# Patient Record
Sex: Female | Born: 1940 | Race: White | Hispanic: No | Smoking: Current every day smoker
Health system: Southern US, Community
[De-identification: ages and names within clinical notes are randomized; demographics above are authoritative.]

## PROBLEM LIST (undated history)

## (undated) DIAGNOSIS — I10 Essential (primary) hypertension: Secondary | ICD-10-CM

## (undated) DIAGNOSIS — J449 Chronic obstructive pulmonary disease, unspecified: Secondary | ICD-10-CM

## (undated) HISTORY — PX: BRAIN SURGERY: SHX531

## (undated) HISTORY — DX: Chronic obstructive pulmonary disease, unspecified: J44.9

## (undated) HISTORY — DX: Essential (primary) hypertension: I10

## (undated) HISTORY — PX: ABDOMINAL HYSTERECTOMY: SHX81

## (undated) HISTORY — PX: CHOLECYSTECTOMY: SHX55

---

## 2015-05-14 ENCOUNTER — Encounter (FREE_STANDING_LABORATORY_FACILITY)
Admit: 2015-05-14 | Discharge: 2015-05-14 | Disposition: A | Discharge status: Home / Self Care | Payer: Self-pay | Attending: Internal Medicine | Admitting: Internal Medicine

## 2015-05-14 ENCOUNTER — Other Ambulatory Visit (FREE_STANDING_LABORATORY_FACILITY): Payer: Self-pay | Admitting: Internal Medicine

## 2015-05-15 LAB — HISTORICAL SURGICAL PATHOLOGY SPECIMEN

## 2015-05-21 ENCOUNTER — Encounter (FREE_STANDING_LABORATORY_FACILITY)
Admit: 2015-05-21 | Discharge: 2015-05-21 | Disposition: A | Discharge status: Home / Self Care | Payer: Self-pay | Attending: Internal Medicine | Admitting: Internal Medicine

## 2015-05-25 LAB — HISTORICAL SURGICAL PATHOLOGY SPECIMEN

## 2016-11-28 ENCOUNTER — Other Ambulatory Visit (HOSPITAL_COMMUNITY): Payer: Self-pay

## 2016-11-28 ENCOUNTER — Other Ambulatory Visit (HOSPITAL_COMMUNITY): Admit: 2016-11-28 | Discharge: 2016-11-28 | Disposition: A | Discharge status: Home / Self Care | Payer: Self-pay

## 2016-11-28 DIAGNOSIS — E119 Type 2 diabetes mellitus without complications: Secondary | ICD-10-CM

## 2016-12-21 ENCOUNTER — Other Ambulatory Visit (HOSPITAL_COMMUNITY): Payer: Self-pay

## 2016-12-21 ENCOUNTER — Ambulatory Visit
Admission: RE | Admit: 2016-12-21 | Discharge: 2016-12-21 | Disposition: A | Discharge status: Home / Self Care | Payer: Medicare Other | Source: Ambulatory Visit

## 2016-12-21 DIAGNOSIS — E039 Hypothyroidism, unspecified: Secondary | ICD-10-CM | POA: Insufficient documentation

## 2016-12-21 DIAGNOSIS — R61 Generalized hyperhidrosis: Secondary | ICD-10-CM

## 2016-12-21 DIAGNOSIS — E119 Type 2 diabetes mellitus without complications: Secondary | ICD-10-CM | POA: Insufficient documentation

## 2016-12-21 LAB — BASIC METABOLIC PANEL, FASTING
ANION GAP: 10 mmol/L (ref 5–15)
BUN/CREA RATIO: 13 (ref 6–20)
BUN: 14 mg/dL (ref 8–26)
CALCIUM: 9.3 mg/dL (ref 8.9–10.3)
CHLORIDE: 99 mmol/L — ABNORMAL LOW (ref 101–111)
CO2 TOTAL: 29 mmol/L (ref 22–32)
CREATININE: 1.07 mg/dL (ref 0.60–1.10)
ESTIMATED GFR: 50 mL/min/1.73mˆ2 — ABNORMAL LOW (ref >60)
GLUCOSE: 108 mg/dL (ref 70–110)
POTASSIUM: 4.3 mmol/L (ref 3.6–5.1)
SODIUM: 138 mmol/L (ref 136–144)

## 2016-12-21 LAB — MICROALBUMIN/CREATININE RATIO, URINE, RANDOM
CREATININE RANDOM URINE: 32 mg/dL
MICROALBUMIN RANDOM URINE: <0.2 mg/dL — ABNORMAL LOW (ref 0.2–1.9)

## 2016-12-21 LAB — HGA1C (HEMOGLOBIN A1C WITH EST AVG GLUCOSE)
ESTIMATED AVERAGE GLUCOSE: 131 mg/dL
HEMOGLOBIN A1C: 6.2 % — ABNORMAL HIGH (ref 4.3–6.1)

## 2016-12-21 LAB — THYROID STIMULATING HORMONE WITH FREE T4 REFLEX: TSH: 2.46 u[IU]/mL (ref 0.340–5.600)

## 2017-01-13 ENCOUNTER — Other Ambulatory Visit
Admission: RE | Admit: 2017-01-13 | Discharge: 2017-01-13 | Disposition: A | Discharge status: Home / Self Care | Payer: Medicare Other

## 2017-01-20 LAB — 5-HYDROXYINDOLEACETIC ACID (5-HIAA), URINE
COLLECTION DURATION: 24 h
COLLECTION DURATION: 24 h
URINE VOLUME: 800 mL

## 2017-03-07 ENCOUNTER — Other Ambulatory Visit (HOSPITAL_COMMUNITY): Payer: Self-pay

## 2017-03-07 DIAGNOSIS — R61 Generalized hyperhidrosis: Secondary | ICD-10-CM

## 2017-03-09 ENCOUNTER — Encounter (HOSPITAL_COMMUNITY): Payer: Self-pay

## 2017-03-09 ENCOUNTER — Other Ambulatory Visit
Admission: RE | Admit: 2017-03-09 | Discharge: 2017-03-09 | Disposition: A | Discharge status: Home / Self Care | Payer: Medicare Other | Source: Ambulatory Visit

## 2017-03-09 DIAGNOSIS — R61 Generalized hyperhidrosis: Secondary | ICD-10-CM | POA: Insufficient documentation

## 2017-03-14 LAB — 5-HYDROXYINDOLEACETIC ACID (5-HIAA), URINE: COLLECTION DURATION: 24 h

## 2017-08-14 ENCOUNTER — Other Ambulatory Visit (HOSPITAL_COMMUNITY): Payer: Self-pay

## 2017-08-14 ENCOUNTER — Ambulatory Visit
Admission: RE | Admit: 2017-08-14 | Discharge: 2017-08-14 | Disposition: A | Discharge status: Home / Self Care | Payer: Medicare Other | Source: Ambulatory Visit

## 2017-08-14 DIAGNOSIS — R079 Chest pain, unspecified: Secondary | ICD-10-CM

## 2017-08-15 LAB — LIPID PANEL
CHOL/HDL RATIO: 4.1 (ref 0.0–4.4)
CHOLESTEROL: 205 mg/dL — ABNORMAL HIGH (ref 0–199)
HDL CHOL: 50 mg/dL (ref 38–85)
LDL CALC: 125 mg/dL — ABNORMAL HIGH (ref 0–100)
TRIGLYCERIDES: 149 mg/dL (ref 0–149)
VLDL CALC: 30 mg/dL (ref 6–49)

## 2017-11-30 ENCOUNTER — Other Ambulatory Visit (HOSPITAL_COMMUNITY): Payer: Self-pay

## 2017-11-30 ENCOUNTER — Other Ambulatory Visit (HOSPITAL_COMMUNITY): Admit: 2017-11-30 | Discharge: 2017-11-30 | Disposition: A | Discharge status: Home / Self Care | Payer: Self-pay

## 2017-11-30 DIAGNOSIS — N183 Chronic kidney disease, stage 3 unspecified (CMS HCC): Secondary | ICD-10-CM

## 2017-11-30 DIAGNOSIS — E1122 Type 2 diabetes mellitus with diabetic chronic kidney disease: Secondary | ICD-10-CM

## 2017-11-30 DIAGNOSIS — E039 Hypothyroidism, unspecified: Secondary | ICD-10-CM

## 2018-01-12 ENCOUNTER — Ambulatory Visit (INDEPENDENT_AMBULATORY_CARE_PROVIDER_SITE_OTHER): Payer: Medicare Other | Admitting: Neurology

## 2018-02-07 ENCOUNTER — Ambulatory Visit (INDEPENDENT_AMBULATORY_CARE_PROVIDER_SITE_OTHER): Payer: Self-pay | Admitting: Neurology

## 2018-02-07 NOTE — Telephone Encounter (Signed)
Sharon Meza there is nothing in system yet . Do you still have these charts that you can send office notes .

## 2018-02-07 NOTE — Telephone Encounter (Signed)
-----   Message from Georgina Peer sent at 02/07/2018  3:08 PM EDT -----  States they referred patient to see Dr Quentin Mulling on 9.13.19 in [place] but they did not get any notes.  Fax # is [phone].  thanks

## 2018-04-05 ENCOUNTER — Encounter (INDEPENDENT_AMBULATORY_CARE_PROVIDER_SITE_OTHER): Payer: Self-pay

## 2018-04-05 NOTE — Nursing Note (Signed)
[facility]- Population Health Patient Navigator    Patient navigator accessed patient's chart to update PCP information [facility].    Sharon PettiesMatthew Amarylis Meza, PATIENT NAVIGATOR  04/05/2018, 12:59

## 2018-09-12 ENCOUNTER — Other Ambulatory Visit: Payer: Self-pay | Admitting: Family Medicine

## 2018-09-12 DIAGNOSIS — R1084 Generalized abdominal pain: Secondary | ICD-10-CM

## 2018-09-19 ENCOUNTER — Ambulatory Visit
Admission: RE | Admit: 2018-09-19 | Discharge: 2018-09-19 | Disposition: A | Discharge status: Home / Self Care | Payer: Medicare Other | Source: Ambulatory Visit | Attending: Family Medicine | Admitting: Family Medicine

## 2018-09-19 DIAGNOSIS — R1084 Generalized abdominal pain: Secondary | ICD-10-CM

## 2018-09-19 MED ORDER — IOPAMIDOL (ISOVUE-300) INJECTION 61%
100.0000 mL | Freq: Once | INTRAVENOUS | Status: AC | PRN
  Administered 2018-09-19: 13:00:00 100 mL via INTRAVENOUS

## 2018-09-25 ENCOUNTER — Telehealth: Payer: Self-pay

## 2018-09-25 ENCOUNTER — Telehealth: Payer: Self-pay | Admitting: Hematology

## 2018-09-25 NOTE — Telephone Encounter (Signed)
Daughter, Pollyann Samples, called to voice concerns about the no visitor policy due to Covid.  "Patient can hear if in a room with others but speaker needs to speak loudly."  I explained role of nurse navigator and educated on various treatment team members that are available to support patient. Daughter has my direct phone number for questions.

## 2018-09-25 NOTE — Progress Notes (Signed)
[facility]   [phone] [phone]   Clinic New Consult Note   Patient Care Team: Lujean Amel, MD as PCP - General (Family Medicine)  Date of Service:  09/26/2018   CHIEF COMPLAINTS/PURPOSE OF CONSULTATION:  Suspicious CT Mass with metastasis  REFERRING PHYSICIAN:  Dr Dorthy Cooler   HISTORY OF PRESENTING ILLNESS:  Kristi Allison 78 y.o. female is a here because of suspicious CT mass with metastasis. The patient was referred by her PCP. The patient presents to the clinic today with GI Navigator Kristi Allison. Her daughter Kristi Allison was called on the phone to be in the visit today and by her historian as the patient is very hard of hearing.   For 1 year she has had significant diarrhea, intermittent ULQ pain, N&V which has worsened lately.. When she moved to [facility] in 11/2017. She then started to see PCP for work up. For years she had discoloration of her legs which was biopsied to be related to diabetes, per daughter. 2-3 weeks ago she developed LE swelling, mainly in her right leg with pain around right ankle. She had gained 20-26 pounds lately due to fluid collection. She tried compression sock and elevation with no help. 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 She was referred to me.   She has mid abdominal pain 7-8/10. 10+ watery diarrhea stool a day, with incontinence wearing 5 depends a day. She has N&V as well. With no bowel movement in a day she will feel constipated with abdominal pressure/bloating. She does not take anything for pain or her diarrhea. She does not like taking pain medications. She is not high functioning and mostly lays down due to leg swelling and pain. She is not using cane or walker. She uses wall to walk.  No chest pain, but has SOB due to COPD. She has had COPD for several years. She is not on oxygen but is suppose to be on inhaler but due to cost she d/c.  Her daughter notes seeing her family  in [place] which is a 5 hour drive.   Socially she lives with her daughter. She has 5 children total.  She has reduced smoking to 5 cigarettes a day. She smoked for 65 years. She was drinking 1-3 drinks a week of coffee liquor, she has not been drinking lately. I reviewed her medication list with her. She is on Lasix and HTN medication and Synthroid. They have a PMHx of. COPD, Hearing loss, HTN, Brain aneurysm s/o shunt placed. In [place] she was told she had spot on lung, which is not suspicions. She had gallbladder removed, hysterectomy, brain surgery for shunt placement.  She cannot remember if her father had cancer. She notes her brother had lung cancer.     REVIEW OF SYSTEMS:    Constitutional: Denies fevers, chills or abnormal night sweats (+) weight gain  Eyes: Denies blurriness of vision, double vision or watery eyes Ears, nose, mouth, throat, and face: Denies mucositis or sore throat (+) hard of hearing  Respiratory: Denies cough, dyspnea or wheezes Cardiovascular: Denies palpitation, chest discomfort (+) B/l LE edema, R>L Gastrointestinal: (+) watery diarrhea, no blood with incontinence (+) Nausea and Vomiting (+) Mid abdominal pain (7-8/10) (+) abdominal bloating and pressure  Skin: Denies abnormal skin rashes (+) Skin discoloration with welts of b/l legs  Lymphatics: Denies new lymphadenopathy or easy bruising Neurological:Denies numbness, tingling or new weaknesses Behavioral/Psych: Mood is stable, no new changes  All other systems were reviewed with the patient and are negative.   MEDICAL HISTORY:  Past Medical History:  Diagnosis Date   COPD (chronic obstructive pulmonary disease) ([place])    Hypertension     SURGICAL HISTORY: Past Surgical History:  Procedure Laterality Date   ABDOMINAL HYSTERECTOMY     BRAIN SURGERY     hemorrhage    CHOLECYSTECTOMY      SOCIAL HISTORY: Social History   Socioeconomic History   Marital status: Unknown    Spouse  name: Not on file   Number of children: 5   Years of education: Not on file   Highest education level: Not on file  Occupational History   Not on file  Social Needs   Financial resource strain: Not on file   Food insecurity:    Worry: Not on file    Inability: Not on file   Transportation needs:    Medical: Not on file    Non-medical: Not on file  Tobacco Use   Smoking status: Current Every Day Smoker    Packs/day: 0.25    Years: 65.00    Pack years: 16.25   Smokeless tobacco: Never Used  Substance and Sexual Activity   Alcohol use: Yes    Alcohol/week: 2.0 standard drinks    Types: 2 Shots of liquor per week    Comment: not recently    Drug use: Not on file   Sexual activity: Not on file  Lifestyle   Physical activity:    Days per week: Not on file    Minutes per session: Not on file   Stress: Not on file  Relationships   Social connections:    Talks on phone: Not on file    Gets together: Not on file    Attends religious service: Not on file    Active member of club or organization: Not on file    Attends meetings of clubs or organizations: Not on file    Relationship status: Not on file   Intimate partner violence:    Fear of current or ex partner: Not on file    Emotionally abused: Not on file    Physically abused: Not on file    Forced sexual activity: Not on file  Other Topics Concern   Not on file  Social History Narrative   Not on file    FAMILY HISTORY: Family History  Problem Relation Age of Onset   Cancer Brother        lung cancer    ALLERGIES:  has No Known Allergies.  MEDICATIONS:  Current Outpatient Medications  Medication Sig Dispense Refill   ascorbic acid (VITAMIN C) 500 MG tablet Take 500 mg by mouth daily.     furosemide (LASIX) 40 MG tablet Take 40 mg by mouth daily. Taking 1 1/2 daily     levothyroxine (SYNTHROID) 88 MCG tablet Take 88 mcg by mouth daily before breakfast.     metoprolol succinate  (TOPROL-XL) 100 MG 24 hr tablet Take 100 mg by mouth daily. Take with or immediately following a meal.     omeprazole (PRILOSEC) 40 MG capsule Take 40 mg by mouth daily.     sertraline (ZOLOFT) 50 MG tablet Take 50 mg by mouth daily.     vitamin E 400 UNIT capsule Take 400 Units by mouth daily.     ondansetron (ZOFRAN) 8 MG tablet Take 1 tablet (8 mg total) by mouth every 8 (eight) hours as needed for nausea or vomiting.  30 tablet 1   prochlorperazine (COMPAZINE) 5 MG tablet Take 1 tablet (5 mg total) by mouth every 6 (six) hours as needed for nausea or vomiting. 30 tablet 0   traMADol (ULTRAM) 50 MG tablet Take 0.5-1 tablets (25-50 mg total) by mouth every 6 (six) hours as needed. 30 tablet 1   No current facility-administered medications for this visit.     PHYSICAL EXAMINATION: ECOG PERFORMANCE STATUS: 3 - Symptomatic, >50% confined to bed  Vitals:   09/26/18 1427  BP: 137/68  Pulse: 61  Resp: 18  Temp: 98.2 F (36.8 C)  SpO2: 99%   Filed Weights   09/26/18 1427  Weight: 187 lb 4.8 oz (85 kg)    GENERAL:alert, no distress and comfortable SKIN: skin color, texture, turgor are normal, no rashes or significant lesions (+) Purplish skin pigmentation of lower right leg proximal and lateral to knee EYES: normal, Conjunctiva are pink and non-injected, sclera clear  NECK: supple, thyroid normal size, non-tender, without nodularity LYMPH:  no palpable lymphadenopathy in the cervical, axillary  LUNGS: clear to auscultation and percussion with normal breathing effort HEART: regular rate & rhythm and no murmurs (+) moderate lower extremity edema, R>L up to knee ABDOMEN:abdomen soft, non-tender and normal bowel sounds (+) epigastric tenderness Musculoskeletal:no cyanosis of digits and no clubbing  NEURO: alert & oriented x 3 with fluent speech, no focal motor/sensory deficits  LABORATORY DATA:  I have reviewed the data as listed No flowsheet data found.  No flowsheet data  found.   RADIOGRAPHIC STUDIES: I have personally reviewed the radiological images as listed and agreed with the findings in the report. Ct Abdomen Pelvis W Contrast  Result Date: 09/20/2018 CLINICAL DATA:  Mid abdomen pain for several months with 20 pound weight gain. EXAM: CT ABDOMEN AND PELVIS WITH CONTRAST TECHNIQUE: Multidetector CT imaging of the abdomen and pelvis was performed using the standard protocol following bolus administration of intravenous contrast. CONTRAST:  17mL ISOVUE-300 IOPAMIDOL (ISOVUE-300) INJECTION 61% COMPARISON:  None. FINDINGS: Lower chest: No acute abnormality. Hepatobiliary: Several abnormal enhancing heterogeneous masses are identified in the liver, largest at the junction the right and left lobe liver measuring at least 8 x 8.8 x 7.6 cm. There is dilatation of intrahepatic biliary tree with abnormal enhancement along the biliary tree. The gallbladder is not seen. Pancreas: There is a 3.9 x 2.8 cm heterogeneous slight low-density masslike lesion in the head of the pancreas. Spleen: Normal in size without focal abnormality. Adrenals/Urinary Tract: Adrenal glands are unremarkable. Kidneys are normal, without renal calculi, focal lesion, or hydronephrosis. Bladder is unremarkable. Stomach/Bowel: Stomach is within normal limits. Appendix appears normal. No evidence of bowel wall thickening, distention, or inflammatory changes. Vascular/Lymphatic: There is abnormal enhancing mass between the liver and the stomach measuring at least 3.6 x 2.6 cm. This may represent a conglomerate of abnormal metastatic lymph nodes. There is infrarenal abdominal aortic aneurysm measuring 4.1 cm in diameter. IVC filter is identified. Reproductive: The uterus is not seen. Other: Moderate volume ascites is identified in the abdomen and pelvis. Musculoskeletal: Small lytic lucencies are identified probably in the T12 vertebral body. IMPRESSION: multiple enhancing masses in the liver largest measures 8.8  cm in size. There is evidence of abnormal enhancement along the intrahepatic biliary tree suggesting neoplasm along the biliary tree. There are abnormal enhancing lymph nodes between the liver and the stomach measuring at least 3.6 cm suggesting metastatic lymph nodes. 3.9 cm heterogeneous slight low-density masslike lesion is identified in the head of  the pancreas. Moderate ascites in the abdomen and pelvis. This constellation of findings is consistent with malignancy. Differential diagnosis includes but is not limited to hepatocellular carcinoma, pancreatic carcinoma, cholangio carcinoma. Electronically Signed   By: Kristi Allison M.D.   On: 09/20/2018 08:09    ASSESSMENT & PLAN:  Rosezella Kronick is a 78 y.o. Caucasian female with a history of COPD, HTN, Hearing Loss   1. Liver and pancreatic lesions, suspicion for metastatic cancer  -I reviewed her symptoms and recent CT abd/pel scan from 09/20/18 with patient and her daughter. CT images were reviewed in person. It shows multiple liver lesions, with a dominant 8.8 cm lesion, a lesion in the, and large adenopathy between liver and stomach, which high suspicion for metastatic disease in liver. The primary is likely pancrease. Given the severe diarrhea, neuroendocrine tumor is also possible. Other metastatic cancer from GI tract or lung cancer (due to her heavy smoking history) are also possible.   -I recommend biopsy of liver lesion to establish diagnosis as well as PET scan for complete staging and looking for primary tumor. She and daughter agreed.  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 However given her advanced age and poor PS she is unlikely an candidate for chemo, which pt and her daughter agree.  -We also discussed palliative care alone. Her daughters are concerned about her quality of life and leaning towards palliative and try to avoid unnecessary tests and procedures. I  discussed with pt and she does want to know the diagnosis and want to pursue PET and biopsy. I discussed treatment depends on type of cancer and her performance status. They understand.  -f/u in 2 weeks after the above work up   2. Mid abdominal pain and bloating  -Not on pain medication, but pain at 7-8/10. She is hesitant to use pain medication.  -I will call in Tramadol today (09/26/18), she can use 25-50mg  q6h as needed .  -she has small ascites, if increases, will consider paracentesis  -Will monitor.    3. Significant diarrhea, N&V -Has watery diarrhea 10+ times a day with incontinence. She changes Depends 5 times a day  -When she takes anti-diarrheals it results in constipation and abdominal pain.  -I encouraged her to take less Imodium to avoid constipation.  -I called in Zofran and Compazine to help with Nausea.    4. LE swelling up to knee, R>L -She tried compression sock and elevation alone which did not control swelling.  -She ambulates holding against wall, unsteady.  -She is on Lasix 60mg  daily.  -Will obtain doppler to rule out blood clot.    5. Smoking history, COPD  -She has been smoking for 65 years. She has recently reduced to 5 cigarettes per day. Her daughter notes she is Angelia Mould not going to quit completely.  -She has COPD, not on Oxygen and not taking inhaler due to high cost.    PLAN:  I called in Tramadol, Zofran, Compazine  Lab and doppler LE tomorrow or Friday  PET scan in 1-2 weeks IR liver biopsy in 1-2 weeks  F/u in 2 weeks after the above workup    Orders Placed This Encounter  Procedures   NM PET Image Initial (PI) Skull Base To Thigh    Standing Status:   Future    Standing Expiration Date:   09/26/2019    Order Specific Question:   If indicated for the ordered  procedure, I authorize the administration of a radiopharmaceutical per Radiology protocol    Answer:   Yes    Order Specific Question:   Preferred imaging location?    Answer:   [facility]    Order Specific Question:   Radiology Contrast Protocol - do NOT remove file path    Answer:   [email or web address]   US BIOPSY (LIVER)    Standing Status:   Future    Standing Expiration Date:   11/26/2019    Order Specific Question:   Lab orders requested (DO NOT place separate lab orders, these will be automatically ordered during procedure specimen collection):    Answer:   Surgical Pathology    Order Specific Question:   Reason for Exam (SYMPTOM  OR DIAGNOSIS REQUIRED)    Answer:   confirm diagnosis and tissue for molecular testing    Order Specific Question:   Preferred location?    Answer:   [facility]   CBC with Differential (Cancer Center Only)    Standing Status:   Future    Standing Expiration Date:   09/26/2019   CMP (Cancer Center only)    Standing Status:   Future    Standing Expiration Date:   09/26/2019   CA 19.9    Standing Status:   Future    Standing Expiration Date:   09/26/2019   CEA (IN HOUSE-[facility])    Standing Status:   Future    Standing Expiration Date:   09/26/2019   Protime-INR    Standing Status:   Future    Standing Expiration Date:   09/26/2019   Chromogranin A    Standing Status:   Future    Standing Expiration Date:   09/26/2019    All questions were answered. The patient knows to call the clinic with any problems, questions or concerns. I spent 50 minutes counseling the patient face to face. The total time spent in the appointment was 60 minutes and more than 50% was on counseling.     Truitt Merle, MD 09/26/2018 5:50 PM  I, Joslyn Devon, am acting as scribe for Truitt Merle, MD.   I have reviewed the above documentation for accuracy and completeness, and I agree with the above.

## 2018-09-25 NOTE — Telephone Encounter (Signed)
A new patient appt has been scheduled for the pt to see Dr. Burr Medico on 5/27 at 230pm. Pt's daughter cld and has been made aware of the appt date and time as well as the no visitor policy. Her daughter states that the pt is hearing impaired.

## 2018-09-26 ENCOUNTER — Telehealth: Payer: Self-pay

## 2018-09-26 ENCOUNTER — Inpatient Hospital Stay: Payer: Medicare Other | Attending: Hematology | Admitting: Hematology

## 2018-09-26 ENCOUNTER — Other Ambulatory Visit: Payer: Self-pay

## 2018-09-26 ENCOUNTER — Encounter: Payer: Self-pay | Admitting: Hematology

## 2018-09-26 DIAGNOSIS — C801 Malignant (primary) neoplasm, unspecified: Secondary | ICD-10-CM | POA: Diagnosis not present

## 2018-09-26 DIAGNOSIS — C787 Secondary malignant neoplasm of liver and intrahepatic bile duct: Secondary | ICD-10-CM | POA: Diagnosis not present

## 2018-09-26 DIAGNOSIS — E119 Type 2 diabetes mellitus without complications: Secondary | ICD-10-CM | POA: Diagnosis not present

## 2018-09-26 DIAGNOSIS — Z90710 Acquired absence of both cervix and uterus: Secondary | ICD-10-CM | POA: Insufficient documentation

## 2018-09-26 DIAGNOSIS — R112 Nausea with vomiting, unspecified: Secondary | ICD-10-CM | POA: Insufficient documentation

## 2018-09-26 DIAGNOSIS — R978 Other abnormal tumor markers: Secondary | ICD-10-CM | POA: Insufficient documentation

## 2018-09-26 DIAGNOSIS — R197 Diarrhea, unspecified: Secondary | ICD-10-CM | POA: Insufficient documentation

## 2018-09-26 DIAGNOSIS — R160 Hepatomegaly, not elsewhere classified: Secondary | ICD-10-CM

## 2018-09-26 DIAGNOSIS — J449 Chronic obstructive pulmonary disease, unspecified: Secondary | ICD-10-CM | POA: Insufficient documentation

## 2018-09-26 DIAGNOSIS — I10 Essential (primary) hypertension: Secondary | ICD-10-CM | POA: Insufficient documentation

## 2018-09-26 DIAGNOSIS — F17210 Nicotine dependence, cigarettes, uncomplicated: Secondary | ICD-10-CM | POA: Diagnosis not present

## 2018-09-26 DIAGNOSIS — Z87891 Personal history of nicotine dependence: Secondary | ICD-10-CM | POA: Diagnosis not present

## 2018-09-26 DIAGNOSIS — R970 Elevated carcinoembryonic antigen [CEA]: Secondary | ICD-10-CM | POA: Insufficient documentation

## 2018-09-26 DIAGNOSIS — Z79899 Other long term (current) drug therapy: Secondary | ICD-10-CM | POA: Diagnosis not present

## 2018-09-26 MED ORDER — ONDANSETRON HCL 8 MG PO TABS: 8.0000 mg | ORAL_TABLET | Freq: Three times a day (TID) | ORAL | 1 refills | Status: AC | PRN

## 2018-09-26 MED ORDER — TRAMADOL HCL 50 MG PO TABS: 25.0000 mg | ORAL_TABLET | Freq: Four times a day (QID) | ORAL | 1 refills | Status: AC | PRN

## 2018-09-26 MED ORDER — PROCHLORPERAZINE MALEATE 5 MG PO TABS: 5.0000 mg | ORAL_TABLET | Freq: Four times a day (QID) | ORAL | 0 refills | Status: AC | PRN

## 2018-09-26 NOTE — Progress Notes (Signed)
Accompanied patient from registration to appointment with Dr. Burr Medico. We were able to have daughter on speaker phone throughout visit. Spoke with daughter at the end of the visit. All questions were answered to her satisfaction. Will continue to follow as needed.

## 2018-09-26 NOTE — Addendum Note (Signed)
Addended by: Truitt Merle on: 09/26/2018 10:13 PM   Modules accepted: Orders

## 2018-09-26 NOTE — Telephone Encounter (Signed)
Talked to daughter to touch base prior to her mom's appointment. Reassured that Dr. Burr Medico will answer questions and include her in the visit.

## 2018-09-27 ENCOUNTER — Telehealth: Payer: Self-pay | Admitting: Hematology

## 2018-09-27 ENCOUNTER — Other Ambulatory Visit: Payer: Self-pay | Admitting: Hematology

## 2018-09-27 ENCOUNTER — Other Ambulatory Visit: Payer: Self-pay

## 2018-09-27 ENCOUNTER — Inpatient Hospital Stay: Payer: Medicare Other

## 2018-09-27 ENCOUNTER — Ambulatory Visit (HOSPITAL_COMMUNITY)
Admission: RE | Admit: 2018-09-27 | Discharge: 2018-09-27 | Disposition: A | Discharge status: Home / Self Care | Payer: Medicare Other | Source: Ambulatory Visit | Attending: Hematology | Admitting: Hematology

## 2018-09-27 DIAGNOSIS — C787 Secondary malignant neoplasm of liver and intrahepatic bile duct: Secondary | ICD-10-CM | POA: Diagnosis not present

## 2018-09-27 DIAGNOSIS — R160 Hepatomegaly, not elsewhere classified: Secondary | ICD-10-CM

## 2018-09-27 LAB — CBC WITH DIFFERENTIAL (CANCER CENTER ONLY)
Abs Immature Granulocytes: 0.02 10*3/uL (ref 0.00–0.07)
Basophils Absolute: 0.1 10*3/uL (ref 0.0–0.1)
Basophils Relative: 1 %
Eosinophils Absolute: 0.1 10*3/uL (ref 0.0–0.5)
Eosinophils Relative: 2 %
HCT: 41.4 % (ref 36.0–46.0)
Hemoglobin: 13.1 g/dL (ref 12.0–15.0)
Immature Granulocytes: 0 %
Lymphocytes Relative: 17 %
Lymphs Abs: 1.2 10*3/uL (ref 0.7–4.0)
MCH: 28.2 pg (ref 26.0–34.0)
MCHC: 31.6 g/dL (ref 30.0–36.0)
MCV: 89 fL (ref 80.0–100.0)
Monocytes Absolute: 0.6 10*3/uL (ref 0.1–1.0)
Monocytes Relative: 8 %
Neutro Abs: 5 10*3/uL (ref 1.7–7.7)
Neutrophils Relative %: 72 %
Platelet Count: 158 10*3/uL (ref 150–400)
RBC: 4.65 MIL/uL (ref 3.87–5.11)
RDW: 18 % — ABNORMAL HIGH (ref 11.5–15.5)
WBC Count: 7 10*3/uL (ref 4.0–10.5)
nRBC: 0 % (ref 0.0–0.2)

## 2018-09-27 LAB — CMP (CANCER CENTER ONLY)
ALT: 25 U/L (ref 0–44)
AST: 86 U/L — ABNORMAL HIGH (ref 15–41)
Albumin: 2.5 g/dL — ABNORMAL LOW (ref 3.5–5.0)
Alkaline Phosphatase: 241 U/L — ABNORMAL HIGH (ref 38–126)
Anion gap: 8 (ref 5–15)
BUN: 9 mg/dL (ref 8–23)
CO2: 28 mmol/L (ref 22–32)
Calcium: 8.1 mg/dL — ABNORMAL LOW (ref 8.9–10.3)
Chloride: 106 mmol/L (ref 98–111)
Creatinine: 0.93 mg/dL (ref 0.44–1.00)
GFR, Est AFR Am: >60 mL/min (ref >60)
GFR, Estimated: 58 mL/min — ABNORMAL LOW (ref >60)
Glucose, Bld: 155 mg/dL — ABNORMAL HIGH (ref 70–99)
Potassium: 3.2 mmol/L — ABNORMAL LOW (ref 3.5–5.1)
Sodium: 142 mmol/L (ref 135–145)
Total Bilirubin: 3.2 mg/dL — ABNORMAL HIGH (ref 0.3–1.2)
Total Protein: 6.5 g/dL (ref 6.5–8.1)

## 2018-09-27 LAB — PROTIME-INR
INR: 1.3 — ABNORMAL HIGH (ref 0.8–1.2)
Prothrombin Time: 15.8 seconds — ABNORMAL HIGH (ref 11.4–15.2)

## 2018-09-27 LAB — CEA (IN HOUSE-CHCC): CEA (CHCC-In House): 6.39 ng/mL — ABNORMAL HIGH (ref 0.00–5.00)

## 2018-09-27 MED ORDER — POTASSIUM CHLORIDE CRYS ER 20 MEQ PO TBCR: 20.0000 meq | EXTENDED_RELEASE_TABLET | Freq: Every day | ORAL | 0 refills | Status: DC

## 2018-09-27 NOTE — Telephone Encounter (Signed)
Scheduled appt per 5/27 los.  Patient aware of the appt date and time.

## 2018-09-27 NOTE — Progress Notes (Signed)
Bilateral lower extremity venous duplex has been completed. Preliminary results can be found in CV Proc through chart review.  Results were given to [facility] at Dr. Ernestina Penna office.  09/27/18 11:08 AM Kristi Allison RVT

## 2018-09-28 LAB — AFP TUMOR MARKER: AFP, Serum, Tumor Marker: 3.1 ng/mL (ref 0.0–8.3)

## 2018-09-28 LAB — CANCER ANTIGEN 19-9: CA 19-9: 667 U/mL — ABNORMAL HIGH (ref 0–35)

## 2018-10-01 ENCOUNTER — Telehealth: Payer: Self-pay

## 2018-10-01 LAB — CHROMOGRANIN A: Chromogranin A (ng/mL): 934.6 ng/mL — ABNORMAL HIGH (ref 0.0–101.8)

## 2018-10-01 NOTE — Telephone Encounter (Signed)
Patient's daughter Lawerance Bach called about the patient's worsening condition.  She states that she appears very yellow, not eating well, had nausea over the weekend which is better today.  Concerned that she is deteriorating faster than expected.  Dr. Burr Medico was made aware and will call her personally.

## 2018-10-03 ENCOUNTER — Telehealth: Payer: Self-pay | Admitting: *Deleted

## 2018-10-03 NOTE — Telephone Encounter (Signed)
Received Advanced Directives via fax on this pt & notified daughter, Welton Flakes per her request that it was received.  Sent to be scanned.

## 2018-10-03 NOTE — Progress Notes (Signed)
[place]   [phone] [phone]   Clinic Follow up Note   Patient Care Team: Lujean Amel, MD as PCP - General (Family Medicine)   I connected with Shelby Mattocks on 10/05/2018 at  3:00 PM EDT by video enabled telemedicine visit and verified that I am speaking with the correct person using two identifiers.  I discussed the limitations, risks, security and privacy concerns of performing an evaluation and management service by telephone and the availability of in person appointments. I also discussed with the patient that there may be a patient responsible charge related to this service. The patient expressed understanding and agreed to proceed.   Other persons participating in the visit and their role in the encounter: Family members (3 daughters)  Patient's location:  Her home  Provider's location:  My Office   CHIEF COMPLAINT: Suspicion for metastatic cancer.   SUMMARY OF ONCOLOGIC HISTORY:   Mass of multiple sites of liver   09/19/2018 Imaging    CT AP 09/19/18 IMPRESSION: multiple enhancing masses in the liver largest measures 8.8 cm in size. There is evidence of abnormal enhancement along the intrahepatic biliary tree suggesting neoplasm along the biliary tree. There are abnormal enhancing lymph nodes between the liver and the stomach measuring at least 3.6 cm suggesting metastatic lymph nodes. 3.9 cm heterogeneous slight low-density masslike lesion is identified in the head of the pancreas. Moderate ascites in the abdomen and pelvis. This constellation of findings is consistent with malignancy. Differential diagnosis includes but is not limited to hepatocellular carcinoma, pancreatic carcinoma, cholangio carcinoma.    09/26/2018 Initial Diagnosis    Mass of multiple sites of liver    10/04/2018 PET scan    PET 10/04/18  IMPRESSION: 1. Hypermetabolic mass centrally within the LEFT hepatic lobe with intense metabolic activity consistent  with primary or secondary malignancy of the liver. 2. Hypermetabolic and enlarged gastrohepatic ligament nodes consistent with metastatic adenopathy. 3. Low metabolic activity associated with the expanded pancreatic head. Difficult to differentiate pancreatic lesion versus thrombus within the main portal vein or combination of both. 4. Moderate volume of intraperitoneal free fluid related to the VP shunt versus hepatic dysfunction.       CURRENT THERAPY:  PENDING hospice   INTERVAL HISTORY:  Kristi Allison is here for a follow up of suspicion for metastatic cancer. She was able to identify herself by face to face video. Her daughter is acting as her historian. She has been fatigued and nauseous. She lays down and takes naps. Anytime she eats or drinks she feels nauseous. She feels the nausea medication is helping her.  She still denies significant abdominal pain, but has more abdominal pressure and bloating. Her daughter notes her swelling has improved today but overall still very significant and her toes are sore. Her daughter feels she is slightly jaundiced. She notes her urine is brown     REVIEW OF SYSTEMS:   Constitutional: Denies fevers, chills or abnormal weight loss (+) fatigue  Eyes: Denies blurriness of vision (+) Mild jaundiced  Ears, nose, mouth, throat, and face: Denies mucositis or sore throat Respiratory: Denies cough, dyspnea or wheezes Cardiovascular: Denies palpitation, chest discomfort (+) Mild improvement in significant lower extremity swelling Gastrointestinal:  Denies  heartburn or change in bowel habits (+) nausea UA: (+) dark urine  Skin: Denies abnormal skin rashes (+) Mild skin jaundiced  Lymphatics: Denies new lymphadenopathy or easy bruising Neurological:Denies numbness, tingling or new weaknesses Behavioral/Psych: Mood is stable, no new changes  All other systems were reviewed with the patient and are negative.  MEDICAL HISTORY:  Past Medical  History:  Diagnosis Date   COPD (chronic obstructive pulmonary disease) ([place])    Hypertension     SURGICAL HISTORY: Past Surgical History:  Procedure Laterality Date   ABDOMINAL HYSTERECTOMY     BRAIN SURGERY     hemorrhage    CHOLECYSTECTOMY      I have reviewed the social history and family history with the patient and they are unchanged from previous note.  ALLERGIES:  has No Known Allergies.  MEDICATIONS:  Current Outpatient Medications  Medication Sig Dispense Refill   ascorbic acid (VITAMIN C) 500 MG tablet Take 500 mg by mouth daily.     furosemide (LASIX) 40 MG tablet Take 40 mg by mouth daily. Taking 1 1/2 daily     levothyroxine (SYNTHROID) 88 MCG tablet Take 88 mcg by mouth daily before breakfast.     metoprolol succinate (TOPROL-XL) 100 MG 24 hr tablet Take 100 mg by mouth daily. Take with or immediately following a meal.     omeprazole (PRILOSEC) 40 MG capsule Take 40 mg by mouth daily.     ondansetron (ZOFRAN) 8 MG tablet Take 1 tablet (8 mg total) by mouth every 8 (eight) hours as needed for nausea or vomiting. 30 tablet 1   potassium chloride SA (K-DUR) 20 MEQ tablet TAKE 1 TABLET BY MOUTH TWICE A DAY FOR 3 DAYS THEN 1 TABLET DAILY THEREAFTER 94 tablet 1   prochlorperazine (COMPAZINE) 5 MG tablet Take 1 tablet (5 mg total) by mouth every 6 (six) hours as needed for nausea or vomiting. 30 tablet 0   sertraline (ZOLOFT) 50 MG tablet Take 50 mg by mouth daily.     traMADol (ULTRAM) 50 MG tablet Take 0.5-1 tablets (25-50 mg total) by mouth every 6 (six) hours as needed. 30 tablet 1   vitamin E 400 UNIT capsule Take 400 Units by mouth daily.     No current facility-administered medications for this visit.    Facility-Administered Medications Ordered in Other Visits  Medication Dose Route Frequency Provider Last Rate Last Dose   fludeoxyglucose F - 18 (FDG) injection 9.3 millicurie  9.3 millicurie Intravenous Once PRN Kerby Moors, MD         PHYSICAL EXAMINATION: ECOG PERFORMANCE STATUS: 3 - Symptomatic, >50% confined to bed  No vitals taken today, Exam not performed today  LABORATORY DATA:  I have reviewed the data as listed CBC Latest Ref Rng & Units 09/27/2018  WBC 4.0 - 10.5 K/uL 7.0  Hemoglobin 12.0 - 15.0 g/dL 13.1  Hematocrit 36.0 - 46.0 % 41.4  Platelets 150 - 400 K/uL 158     CMP Latest Ref Rng & Units 09/27/2018  Glucose 70 - 99 mg/dL 155(H)  BUN 8 - 23 mg/dL 9  Creatinine 0.44 - 1.00 mg/dL 0.93  Sodium 135 - 145 mmol/L 142  Potassium 3.5 - 5.1 mmol/L 3.2(L)  Chloride 98 - 111 mmol/L 106  CO2 22 - 32 mmol/L 28  Calcium 8.9 - 10.3 mg/dL 8.1(L)  Total Protein 6.5 - 8.1 g/dL 6.5  Total Bilirubin 0.3 - 1.2 mg/dL 3.2(H)  Alkaline Phos 38 - 126 U/L 241(H)  AST 15 - 41 U/L 86(H)  ALT 0 - 44 U/L 25      RADIOGRAPHIC STUDIES: I have personally reviewed the radiological images as listed and agreed with the findings in the report. Nm Pet Image Initial (pi) Skull Base To Thigh  Result Date: 10/04/2018 CLINICAL DATA:  Initial treatment strategy for hepatic mass. EXAM: NUCLEAR MEDICINE PET SKULL BASE TO THIGH TECHNIQUE: 9.3 mCi F-18 FDG was injected intravenously. Full-ring PET imaging was performed from the skull base to thigh after the radiotracer. CT data was obtained and used for attenuation correction and anatomic localization. Fasting blood glucose: 100 mg/dl COMPARISON:  CT 09/19/2018 FINDINGS: Mediastinal blood pool activity: SUV max 2.61 Liver activity: SUV max NA NECK: No hypermetabolic lymph nodes in the neck. Incidental CT findings: none CHEST: No hypermetabolic mediastinal or hilar nodes. No suspicious pulmonary nodules on the CT scan. Incidental CT findings: Coronary calcifications. VP shunt catheter extending along the medial RIGHT chest wall. ABDOMEN/PELVIS: Large hypermetabolic mass centrally within the liver (predominately segment 4A and 4B) measures approximately 8.6 by 5.3 cm with intense metabolic  activity SUV max equal 12.1. Several adjacent satellite lesions (image 103 and 110). There is moderate hypermetabolic activity associated with enlarged gastrohepatic ligament nodes with SUV max equal 7.1. Nodes measure up to 2.6 cm (image 107/4). There is minimal hypermetabolic activity within the pancreatic head. Difficult to if the expansion of the expansion of pancreatic head is due to lesion or thrombosis of the portal vein or pancreatic lesion or combination of both. Incidental CT findings: Moderate volume intraperitoneal free fluid. VP shunt catheter extends the pelvis. Moderate volume of free fluid the pelvis. Spleen is within normal limits. No hypermetabolic periaortic or pelvic lymph nodes. IVC filter in infrarenal location. SKELETON: No focal hypermetabolic activity to suggest skeletal metastasis. Incidental CT findings: none IMPRESSION: 1. Hypermetabolic mass centrally within the LEFT hepatic lobe with intense metabolic activity consistent with primary or secondary malignancy of the liver. 2. Hypermetabolic and enlarged gastrohepatic ligament nodes consistent with metastatic adenopathy. 3. Low metabolic activity associated with the expanded pancreatic head. Difficult to differentiate pancreatic lesion versus thrombus within the main portal vein or combination of both. 4. Moderate volume of intraperitoneal free fluid related to the VP shunt versus hepatic dysfunction. Electronically Signed   By: Suzy Bouchard M.D.   On: 10/04/2018 12:12     ASSESSMENT & PLAN:  Kristi Allison is a 78 y.o. female with   1. Multiple liver lesions and abdominal adenopathy, probably hepatocellular carcinoma, cholangiocarcinoma or metastatic cancer with unknown primary  -We discussed her PET scan from 10/04/18 which shows hypermetabolic large mass in left liver lobe and a few smaller satellite liver lesions. There is hypermetabolic in LN surrounding Liver. There is low metabolic activity associated with pancreas which can  be adjacent LN, primary pancreatic cancer is not favored. 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 No suspicious evidence of chest. Her CT scan was discussed in our tumor board 2 days ago.  -I reviewed her results, she has significant elevated CA 19.9, AFP normal, CEA slightly above normal, chromogranin A was elevated also, however given the FDG avid lesions this is unlikely neuroendocrine tumor. -I discussed based on PET and lab results this is highly suggestive of primary Liver or Bile duct cancer. I discussed that tissue biopsy is need for definitive diagnosis.  -Regardless of either diagnosis, I discussed at this stage she is not a candidate for surgery to cure her disease. I also don't think she is a candidate for liver targeted therpay such as Y90 or chemo treatment given her advanced age, performance status and hyperbilirubinemia.  Per patient's daughter, her jaundice has gotten worse lately.  -Pt's daughters's main concern is her quality  of life and do not want her go through risky procedures or treatment.  I recommend palliative care and hospice to manage her symptoms and improve her quality of life. She can have home Hospice, and as she progresses she can be considered residential hospice. She is interested. Her daughters are in full agreement. -I will send hospice home care referral. -f/u open   2. Mid abdominal pain and bloating  -Not on pain medication, but pain at 7-8/10. She is hesitant to use pain medication.  -I previously prescribed Tramadol (09/26/18), she can use 25-50mg  q6h as needed. -She has small ascites, if increases, will consider paracentesis  -Will monitor. Pain still manageable, mainly pressure from bloating. She has not taken any pain medication.    3. Significant diarrhea, N&V -Has watery diarrhea 10+ times a day with incontinence. She changes Depends 5 times a day  -When she takes anti-diarrheals it results  in constipation and abdominal pain.  -I encouraged her to take less Imodium to avoid constipation.  -I called in Zofran and Compazine to help with Nausea.    4. LE swelling up to knee, R>L -She tried compression sock and elevation alone which did not control swelling.  -She ambulates holding against wall, unsteady.  -She is on Lasix 60mg  daily.  -doppler was negative for DVT -Her LE swelling has mildly improved   5. Smoking history, COPD  -She has been smoking for 65 years. She has recently reduced to 5 cigarettes per day. Her daughter notes she is Angelia Mould not going to quit completely.  -She has COPD, not on Oxygen and not taking inhaler due to high cost.    PLAN:  -PET scan reviewed with patient and family  -Will send hospice referral, pt and her daughters all agreed. I will be happy to remain as her MD  -F/u as needed    No problem-specific Assessment & Plan notes found for this encounter.   No orders of the defined types were placed in this encounter.  I discussed the assessment and treatment plan with the patient. The patient was provided an opportunity to ask questions and all were answered. The patient agreed with the plan and demonstrated an understanding of the instructions.  The patient was advised to call back or seek an in-person evaluation if the symptoms worsen or if the condition fails to improve as anticipated.  I provided 40 minutes of face-to-face video visit time during this encounter, and > 50% was spent counseling as documented under my assessment & plan.    Truitt Merle, MD 10/05/2018   I, Joslyn Devon, am acting as scribe for Truitt Merle, MD.   I have reviewed the above documentation for accuracy and completeness, and I agree with the above.

## 2018-10-04 ENCOUNTER — Ambulatory Visit (HOSPITAL_COMMUNITY)
Admission: RE | Admit: 2018-10-04 | Discharge: 2018-10-04 | Disposition: A | Discharge status: Home / Self Care | Payer: Medicare Other | Source: Ambulatory Visit | Attending: Hematology | Admitting: Hematology

## 2018-10-04 ENCOUNTER — Other Ambulatory Visit: Payer: Self-pay

## 2018-10-04 DIAGNOSIS — Z79899 Other long term (current) drug therapy: Secondary | ICD-10-CM | POA: Insufficient documentation

## 2018-10-04 DIAGNOSIS — C787 Secondary malignant neoplasm of liver and intrahepatic bile duct: Secondary | ICD-10-CM

## 2018-10-04 DIAGNOSIS — R590 Localized enlarged lymph nodes: Secondary | ICD-10-CM | POA: Diagnosis not present

## 2018-10-04 LAB — GLUCOSE, CAPILLARY: Glucose-Capillary: 100 mg/dL — ABNORMAL HIGH (ref 70–99)

## 2018-10-04 IMAGING — PT NUCLEAR MEDICINE PET IMAGE INITIAL (PI) SKULL BASE TO THIGH
1 series · 7 of 7 positions shown · non-contrast
Comparison: CT 09/19/2018

CLINICAL DATA: Initial treatment strategy for hepatic mass.

EXAM:
NUCLEAR MEDICINE PET SKULL BASE TO THIGH
TECHNIQUE: 9.3 mCi F-18 FDG was injected intravenously. Full-ring PET imaging
was performed from the skull base to thigh after the radiotracer. CT
data was obtained and used for attenuation correction and anatomic
localization.
Fasting blood glucose: 100 mg/dl

[Series 1073: results mm oncology reading · 1.0mm · 0.89mm/px · 7 of 7 slices shown]
[im 1/7]
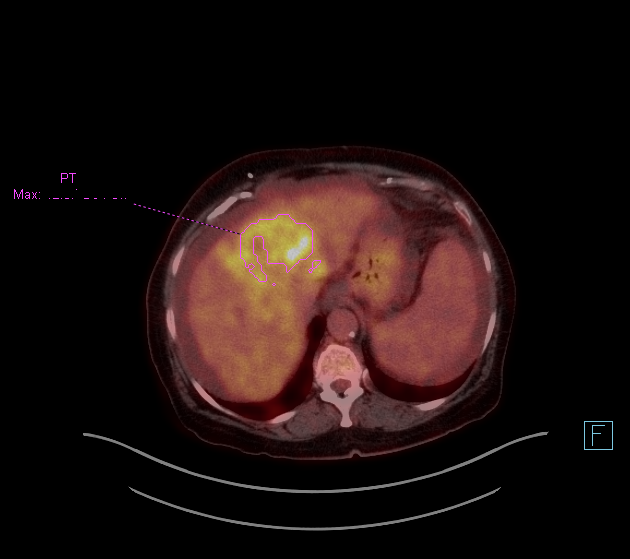
[im 2/7]
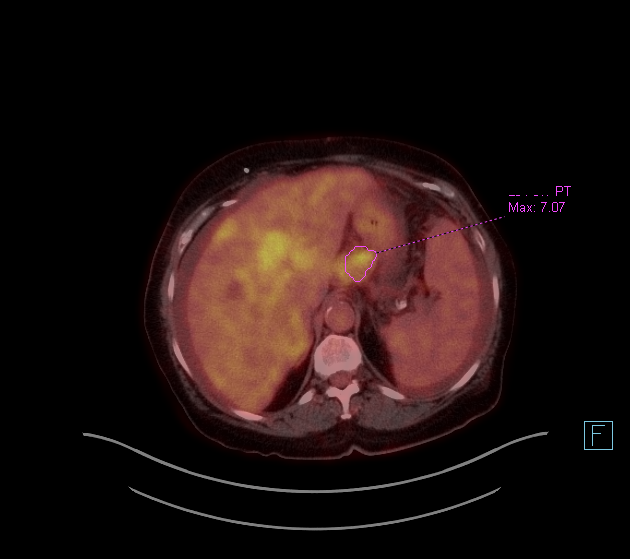
[im 3/7]
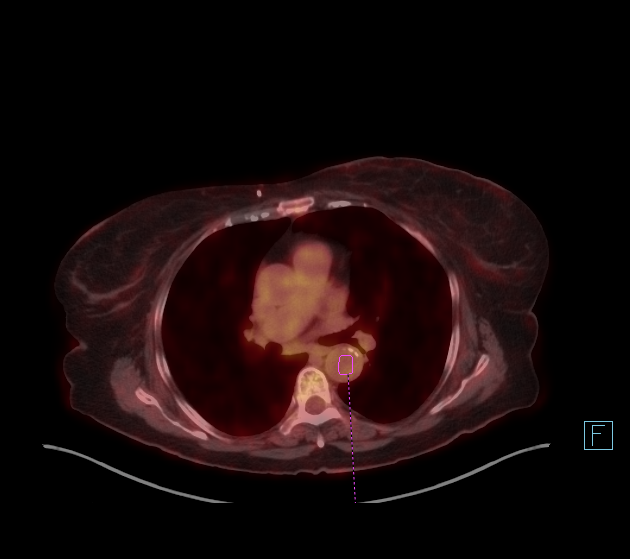
[im 4/7]
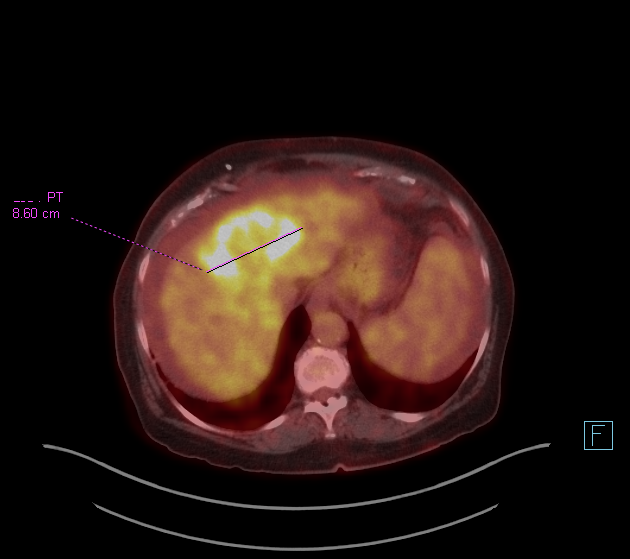
[im 5/7]
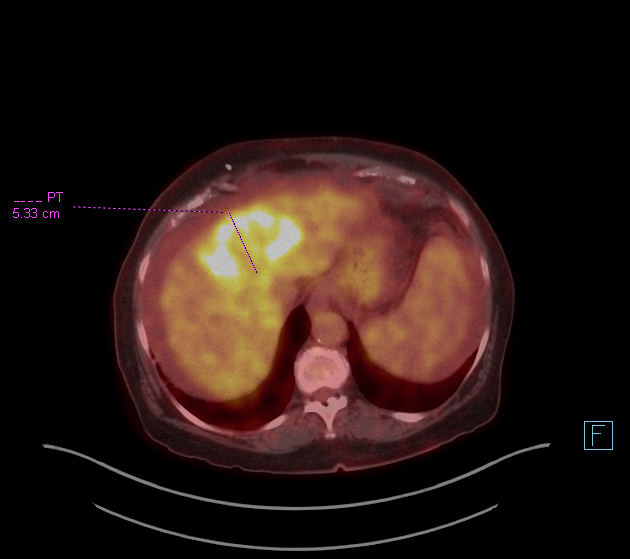
[im 6/7]
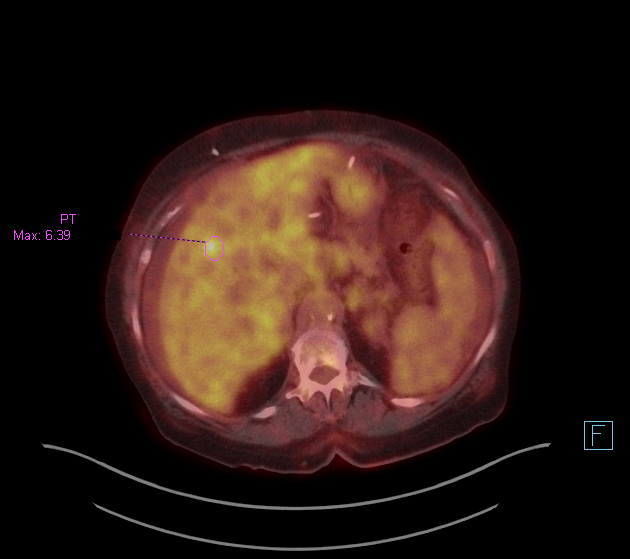
[im 7/7]
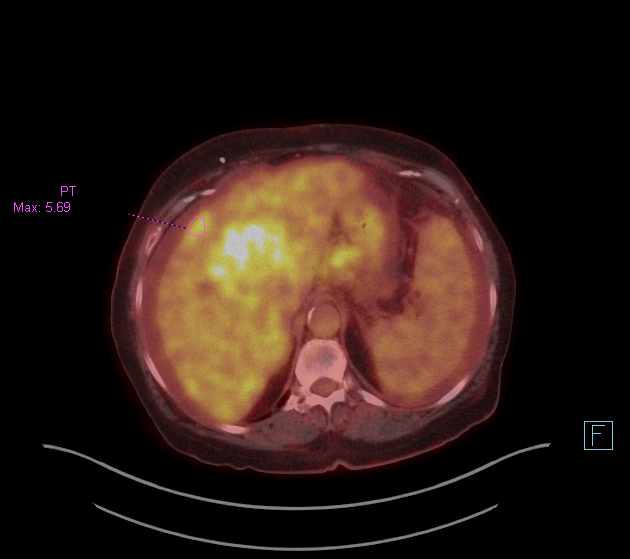

[7 of 7 positions shown; findings below may reference images not displayed]

FINDINGS: Mediastinal blood pool activity: SUV max

Liver activity: SUV max NA

NECK: No hypermetabolic lymph nodes in the neck.

Incidental CT findings: none

CHEST: No hypermetabolic mediastinal or hilar nodes. No suspicious
pulmonary nodules on the CT scan.

Incidental CT findings: Coronary calcifications. VP shunt catheter
extending along the medial RIGHT chest wall.

ABDOMEN/PELVIS: Large hypermetabolic mass centrally within the liver
(predominately segment 4A and 4B) measures approximately 8.6 by
cm with intense metabolic activity SUV max equal 12.1. Several
adjacent satellite lesions (image 103 and 110).

There is moderate hypermetabolic activity associated with enlarged
gastrohepatic ligament nodes with SUV max equal 7.1. Nodes measure
up to 2.6 cm (image 107/4).

There is minimal hypermetabolic activity within the pancreatic head.
Difficult to if the expansion of the expansion of pancreatic head is
due to lesion or thrombosis of the portal vein or pancreatic lesion
or combination of both.

Incidental CT findings: Moderate volume intraperitoneal free fluid.
VP shunt catheter extends the pelvis. Moderate volume of free fluid
the pelvis. Spleen is within normal limits. No hypermetabolic
periaortic or pelvic lymph nodes.

IVC filter in infrarenal location.

SKELETON: No focal hypermetabolic activity to suggest skeletal
metastasis.

Incidental CT findings: none
IMPRESSION: 1. Hypermetabolic mass centrally within the LEFT hepatic lobe with
intense metabolic activity consistent with primary or secondary
malignancy of the liver.
2. Hypermetabolic and enlarged gastrohepatic ligament nodes
consistent with metastatic adenopathy.
3. Low metabolic activity associated with the expanded pancreatic
head. Difficult to differentiate pancreatic lesion versus thrombus
within the main portal vein or combination of both.
4. Moderate volume of intraperitoneal free fluid related to the VP
shunt versus hepatic dysfunction.

## 2018-10-04 MED ORDER — FLUDEOXYGLUCOSE F - 18 (FDG) INJECTION: 9.3000 | Freq: Once | INTRAVENOUS | Status: DC | PRN

## 2018-10-05 ENCOUNTER — Telehealth: Payer: Self-pay | Admitting: *Deleted

## 2018-10-05 ENCOUNTER — Inpatient Hospital Stay: Payer: Medicare Other | Attending: Hematology | Admitting: Hematology

## 2018-10-05 DIAGNOSIS — I10 Essential (primary) hypertension: Secondary | ICD-10-CM

## 2018-10-05 DIAGNOSIS — K769 Liver disease, unspecified: Secondary | ICD-10-CM | POA: Diagnosis not present

## 2018-10-05 DIAGNOSIS — Z79899 Other long term (current) drug therapy: Secondary | ICD-10-CM | POA: Diagnosis not present

## 2018-10-05 DIAGNOSIS — R160 Hepatomegaly, not elsewhere classified: Secondary | ICD-10-CM

## 2018-10-05 NOTE — Telephone Encounter (Signed)
Referral called to [facility] for Hospice .

## 2018-10-06 ENCOUNTER — Encounter: Payer: Self-pay | Admitting: Hematology

## 2018-10-08 ENCOUNTER — Telehealth: Payer: Self-pay | Admitting: Hematology

## 2018-10-08 NOTE — Telephone Encounter (Signed)
No los per 6/5. °

## 2018-10-11 ENCOUNTER — Ambulatory Visit: Payer: No Typology Code available for payment source | Admitting: Hematology

## 2018-11-12 ENCOUNTER — Telehealth: Payer: Self-pay

## 2018-12-01 NOTE — Telephone Encounter (Signed)
Death Certificate picked up by Arloa Koh [facility]

## 2018-12-01 DEATH — deceased
